# Patient Record
Sex: Male | Born: 1985 | Race: Black or African American | Hispanic: No | Marital: Single | Smoking: Never smoker
Health system: Southern US, Community
[De-identification: ages and names within clinical notes are randomized; demographics above are authoritative.]

## PROBLEM LIST (undated history)

## (undated) HISTORY — PX: WRIST RECONSTRUCTION: SHX2675

## (undated) HISTORY — PX: FEMUR SURGERY: SHX943

---

## 2016-07-09 ENCOUNTER — Ambulatory Visit: Payer: Self-pay | Admitting: Family

## 2016-07-17 ENCOUNTER — Ambulatory Visit: Payer: Self-pay | Admitting: Family

## 2016-07-31 ENCOUNTER — Encounter (HOSPITAL_COMMUNITY): Payer: Self-pay | Admitting: Emergency Medicine

## 2016-07-31 ENCOUNTER — Emergency Department (HOSPITAL_COMMUNITY)
Admission: EM | Admit: 2016-07-31 | Discharge: 2016-07-31 | Disposition: A | Discharge status: Home / Self Care | Payer: Self-pay | Attending: Emergency Medicine | Admitting: Emergency Medicine

## 2016-07-31 DIAGNOSIS — Z791 Long term (current) use of non-steroidal anti-inflammatories (NSAID): Secondary | ICD-10-CM | POA: Insufficient documentation

## 2016-07-31 DIAGNOSIS — Z79899 Other long term (current) drug therapy: Secondary | ICD-10-CM | POA: Insufficient documentation

## 2016-07-31 DIAGNOSIS — R197 Diarrhea, unspecified: Secondary | ICD-10-CM | POA: Insufficient documentation

## 2016-07-31 DIAGNOSIS — R112 Nausea with vomiting, unspecified: Secondary | ICD-10-CM | POA: Insufficient documentation

## 2016-07-31 LAB — URINALYSIS, ROUTINE W REFLEX MICROSCOPIC
BACTERIA UA: NONE SEEN
Glucose, UA: NEGATIVE mg/dL
Hgb urine dipstick: NEGATIVE
Ketones, ur: 20 mg/dL — AB
Nitrite: NEGATIVE
PROTEIN: 100 mg/dL — AB
Specific Gravity, Urine: 1.035 — ABNORMAL HIGH (ref 1.005–1.030)
pH: 6 (ref 5.0–8.0)

## 2016-07-31 LAB — CBC
HEMATOCRIT: 44.5 % (ref 39.0–52.0)
Hemoglobin: 15.6 g/dL (ref 13.0–17.0)
MCH: 28.5 pg (ref 26.0–34.0)
MCHC: 35.1 g/dL (ref 30.0–36.0)
MCV: 81.2 fL (ref 78.0–100.0)
Platelets: 214 10*3/uL (ref 150–400)
RBC: 5.48 MIL/uL (ref 4.22–5.81)
RDW: 13.7 % (ref 11.5–15.5)
WBC: 9.7 10*3/uL (ref 4.0–10.5)

## 2016-07-31 LAB — LIPASE, BLOOD: LIPASE: 19 U/L (ref 11–51)

## 2016-07-31 LAB — COMPREHENSIVE METABOLIC PANEL
ALT: 24 U/L (ref 17–63)
AST: 25 U/L (ref 15–41)
Albumin: 4.5 g/dL (ref 3.5–5.0)
Alkaline Phosphatase: 53 U/L (ref 38–126)
Anion gap: 9 (ref 5–15)
BUN: 15 mg/dL (ref 6–20)
CHLORIDE: 103 mmol/L (ref 101–111)
CO2: 27 mmol/L (ref 22–32)
CREATININE: 1.05 mg/dL (ref 0.61–1.24)
Calcium: 9.2 mg/dL (ref 8.9–10.3)
Glucose, Bld: 115 mg/dL — ABNORMAL HIGH (ref 65–99)
POTASSIUM: 3.2 mmol/L — AB (ref 3.5–5.1)
SODIUM: 139 mmol/L (ref 135–145)
Total Bilirubin: 0.7 mg/dL (ref 0.3–1.2)
Total Protein: 7.8 g/dL (ref 6.5–8.1)

## 2016-07-31 MED ORDER — ONDANSETRON HCL 4 MG PO TABS: 4.0000 mg | ORAL_TABLET | Freq: Three times a day (TID) | ORAL | 0 refills | Status: DC | PRN

## 2016-07-31 MED ORDER — ONDANSETRON 4 MG PO TBDP
4.0000 mg | ORAL_TABLET | Freq: Once | ORAL | Status: AC
  Administered 2016-07-31: 4 mg via ORAL
  Filled 2016-07-31: qty 1

## 2016-07-31 NOTE — ED Notes (Signed)
Pt informed of need for urine sample. Pt states he does not have to go at this time but was instructed to let nursing staff know when able to provide one. Upon entering the room pt drinking his second ginger ale and reports no nausea or vomiting while drinking.

## 2016-07-31 NOTE — ED Triage Notes (Signed)
Pt states he started having nausea and vomiting last night and a small amount of diarrhea today

## 2016-07-31 NOTE — ED Notes (Signed)
Lab put dark green blood tube in POC room if needed

## 2016-07-31 NOTE — ED Provider Notes (Signed)
[facility]EM[facility]ENCY DEPT Provider Note   CSN: [id number] Arrival date & time: 07/31/16  1904   By signing my name below, I, Keith Lopez, attest that this documentation has been prepared under the direction and in the presence of [name], Barbara CowerJason, MD. Electronically signed, Keith Lopez, ED Scribe. 07/31/16. 9:58 PM.   History   Chief Complaint Chief Complaint  Patient presents with  . Emesis   The history is provided by the patient and medical records. No language interpreter was used.    Keith Lopez is a 31 y.o. male who presents to the Emergency Department with concern for ongoing vomiting since last night. He states he ate old chicken prior to onset of symptoms and he drank 1 alcoholic beverage. Associated nausea, diarrhea, dysuria, dark urine, R sided sharp abdominal pains during vomiting spells noted. Pt states he has vomited ~25-30 since onset and last vomited 2-3 hours PTA. No diarrhea, vomiting noted since pt has been in [facility] ED. He describes stomach contents progressing to white and then yellow contents in his vomit. No illicit drugs noted. No modifying factors noted. Pt on hydrocodone and amoxicillin after having teeth pulled 1 week ago. No leg swelling, rash, sick contacts, recent travel, fever noted. No other complaints at this time.    History reviewed. No pertinent past medical history.  There are no active problems to display for this patient.   Past Surgical History:  Procedure Laterality Date  . FEMUR SURGERY    . WRIST RECONSTRUCTION         Home Medications    Prior to Admission medications   Medication Sig Start Date End Date Taking? Authorizing Provider  amoxicillin (AMOXIL) 875 MG tablet TAKE 1 TABLET BY MOUTH EVERY TWELVE HOURS 07/20/16  Yes [provider]  HYDROcodone-acetaminophen (NORCO/VICODIN) 5-325 MG tablet Take 1 tablet by mouth every 6 (six) hours as needed. for pain 07/20/16  Yes [provider]  ibuprofen (ADVIL,MOTRIN) 600 MG  tablet Take 600 mg by mouth every 6 (six) hours as needed. 07/20/16  Yes [provider]  ondansetron (ZOFRAN) 4 MG tablet Take 1 tablet (4 mg total) by mouth every 8 (eight) hours as needed for nausea or vomiting. 07/31/16   Tyan Lasure, Barbara CowerJason, MD    Family History History reviewed. No pertinent family history.  Social History Social History  Substance Use Topics  . Smoking status: Never Smoker  . Smokeless tobacco: Never Used  . Alcohol use Yes     Comment: rarely     Allergies   Patient has no known allergies.   Review of Systems Review of Systems All other systems reviewed and all systems are negative for acute changes except as noted in the HPI and PMH.    Physical Exam Updated Vital Signs BP 111/69 (BP Location: Right Arm)   Pulse 73   Temp 98.6 F (37 C) (Oral)   Resp 17   Ht 6\' 3"  (1.905 m)   Wt 210 lb (95.3 kg)   SpO2 100%   BMI 26.25 kg/m   Physical Exam  Constitutional: He is oriented to person, place, and time. He appears well-developed and well-nourished.  HENT:  Head: Normocephalic.  Mouth/Throat: Oropharynx is clear and moist.  Eyes: EOM are normal.  Neck: Normal range of motion.  Cardiovascular: Normal rate, regular rhythm, normal heart sounds and intact distal pulses.   Pulmonary/Chest: Effort normal and breath sounds normal. No respiratory distress.  Abdominal: Soft. Bowel sounds are normal. He exhibits no distension. There is no  tenderness. There is no rebound and no guarding.  Musculoskeletal: Normal range of motion.  Neurological: He is alert and oriented to person, place, and time.  Psychiatric: He has a normal mood and affect.  Nursing note and vitals reviewed.    ED Treatments / Results  DIAGNOSTIC STUDIES: Oxygen Saturation is 100% on RA, NL by my interpretation.    COORDINATION OF CARE: 9:54 PM-Discussed next steps with pt. Pt verbalized understanding and is agreeable with the plan. Pt prepared for d/c, advised of symptomatic care  at home and return precautions.    Labs (all labs ordered are listed, but only abnormal results are displayed) Labs Reviewed  COMPREHENSIVE METABOLIC PANEL - Abnormal; Notable for the following:       Result Value   Potassium 3.2 (*)    Glucose, Bld 115 (*)    All other components within normal limits  URINALYSIS, ROUTINE W REFLEX MICROSCOPIC - Abnormal; Notable for the following:    APPearance HAZY (*)    Specific Gravity, Urine 1.035 (*)    Bilirubin Urine SMALL (*)    Ketones, ur 20 (*)    Protein, ur 100 (*)    Leukocytes, UA SMALL (*)    Squamous Epithelial / LPF 0-5 (*)    All other components within normal limits  LIPASE, BLOOD  CBC    EKG  EKG Interpretation None       Radiology No results found.  Procedures Procedures (including critical care time)  Medications Ordered in ED Medications  ondansetron (ZOFRAN-ODT) disintegrating tablet 4 mg (4 mg Oral Given 07/31/16 2304)     Initial Impression / Assessment and Plan / ED Course  I have reviewed the triage vital signs and the nursing notes.  Pertinent labs & imaging results that were available during my care of the patient were reviewed by me and considered in my medical decision making (see chart for details).     Emesis that improved prior to arrival. Tolerating PO. Benign abdomen. Suspect viral cause at this time. Low suspicion for obstruction, bacterial diarrhea, IBD or other more serious causes at this time.   Final Clinical Impressions(s) / ED Diagnoses   Final diagnoses:  Nausea and vomiting, intractability of vomiting not specified, unspecified vomiting type    New Prescriptions Discharge Medication List as of 07/31/2016 11:31 PM    START taking these medications   Details  ondansetron (ZOFRAN) 4 MG tablet Take 1 tablet (4 mg total) by mouth every 8 (eight) hours as needed for nausea or vomiting., Starting Tue 07/31/2016, Print      I personally performed the services described in this  documentation, which was scribed in my presence. The recorded information has been reviewed and is accurate.      Marily Memos, MD 08/02/16 1226

## 2016-08-06 ENCOUNTER — Ambulatory Visit: Payer: Self-pay | Admitting: Family Medicine

## 2016-08-07 ENCOUNTER — Encounter: Payer: Self-pay | Admitting: Family Medicine

## 2018-01-31 ENCOUNTER — Ambulatory Visit (HOSPITAL_COMMUNITY)
Admission: RE | Admit: 2018-01-31 | Discharge: 2018-01-31 | Disposition: A | Discharge status: Home / Self Care | Payer: Self-pay | Source: Ambulatory Visit | Attending: Internal Medicine | Admitting: Internal Medicine

## 2018-01-31 ENCOUNTER — Other Ambulatory Visit (HOSPITAL_COMMUNITY): Payer: Self-pay | Admitting: Internal Medicine

## 2018-01-31 DIAGNOSIS — M25532 Pain in left wrist: Secondary | ICD-10-CM

## 2018-01-31 DIAGNOSIS — S62002A Unspecified fracture of navicular [scaphoid] bone of left wrist, initial encounter for closed fracture: Secondary | ICD-10-CM | POA: Insufficient documentation

## 2018-01-31 DIAGNOSIS — M19032 Primary osteoarthritis, left wrist: Secondary | ICD-10-CM | POA: Insufficient documentation

## 2018-01-31 DIAGNOSIS — X58XXXA Exposure to other specified factors, initial encounter: Secondary | ICD-10-CM | POA: Insufficient documentation

## 2018-03-04 ENCOUNTER — Ambulatory Visit: Payer: Self-pay | Admitting: Orthopaedic Surgery

## 2018-03-04 ENCOUNTER — Encounter: Payer: Self-pay | Admitting: Orthopaedic Surgery

## 2019-01-13 ENCOUNTER — Other Ambulatory Visit: Payer: Self-pay | Admitting: *Deleted

## 2019-01-13 DIAGNOSIS — Z20822 Contact with and (suspected) exposure to covid-19: Secondary | ICD-10-CM

## 2019-01-14 LAB — NOVEL CORONAVIRUS, NAA: SARS-CoV-2, NAA: NOT DETECTED

## 2019-03-24 IMAGING — DX DG WRIST COMPLETE 3+V*L*
4 series · 4 of 4 positions shown · non-contrast
Comparison: None.

CLINICAL DATA: Ulnar-sided left wrist pain. History of remote
fracture

EXAM:
LEFT WRIST - COMPLETE 3+ VIEW

[wrist pa]
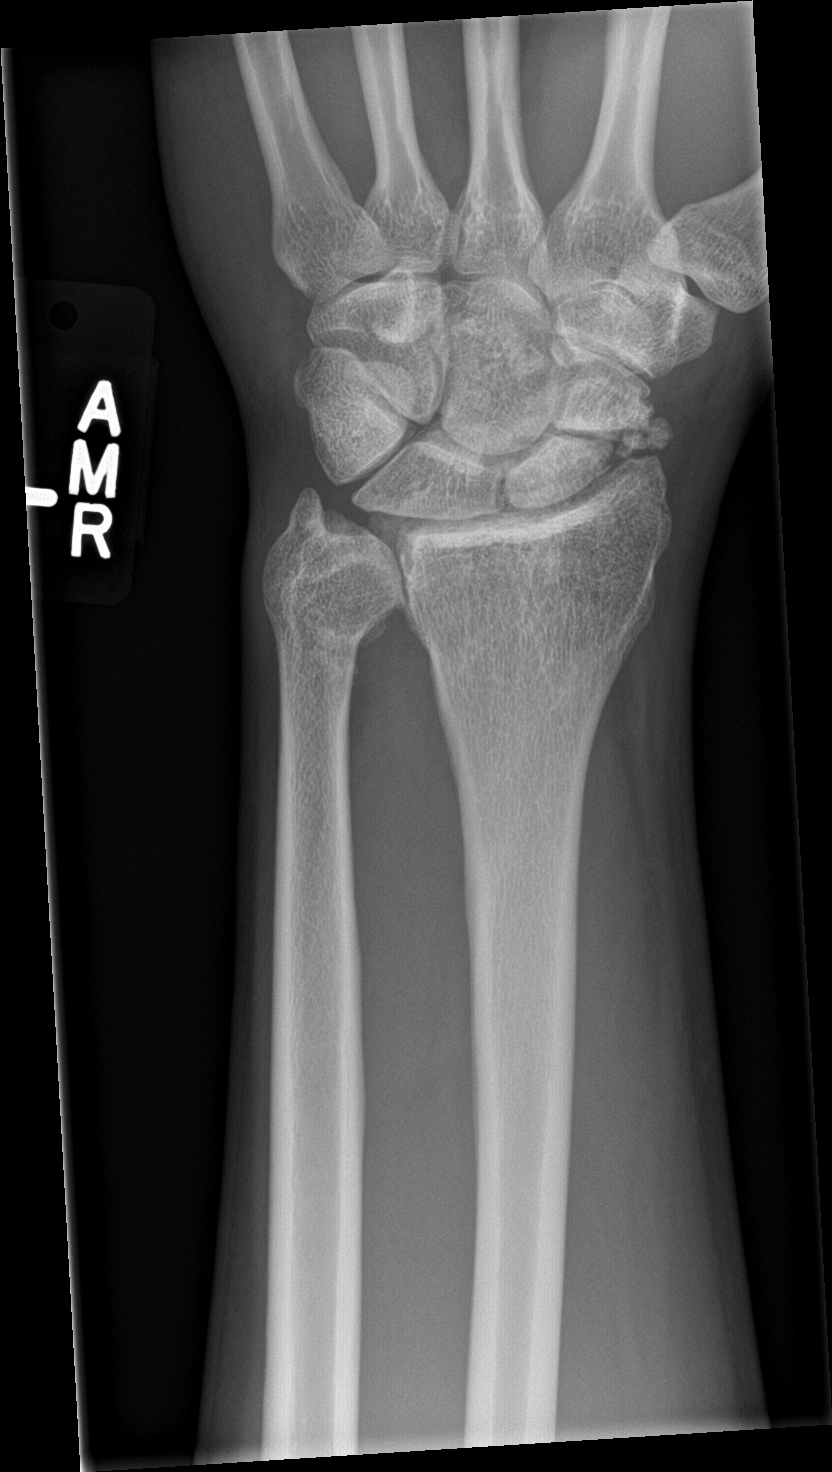

[wrist obl]
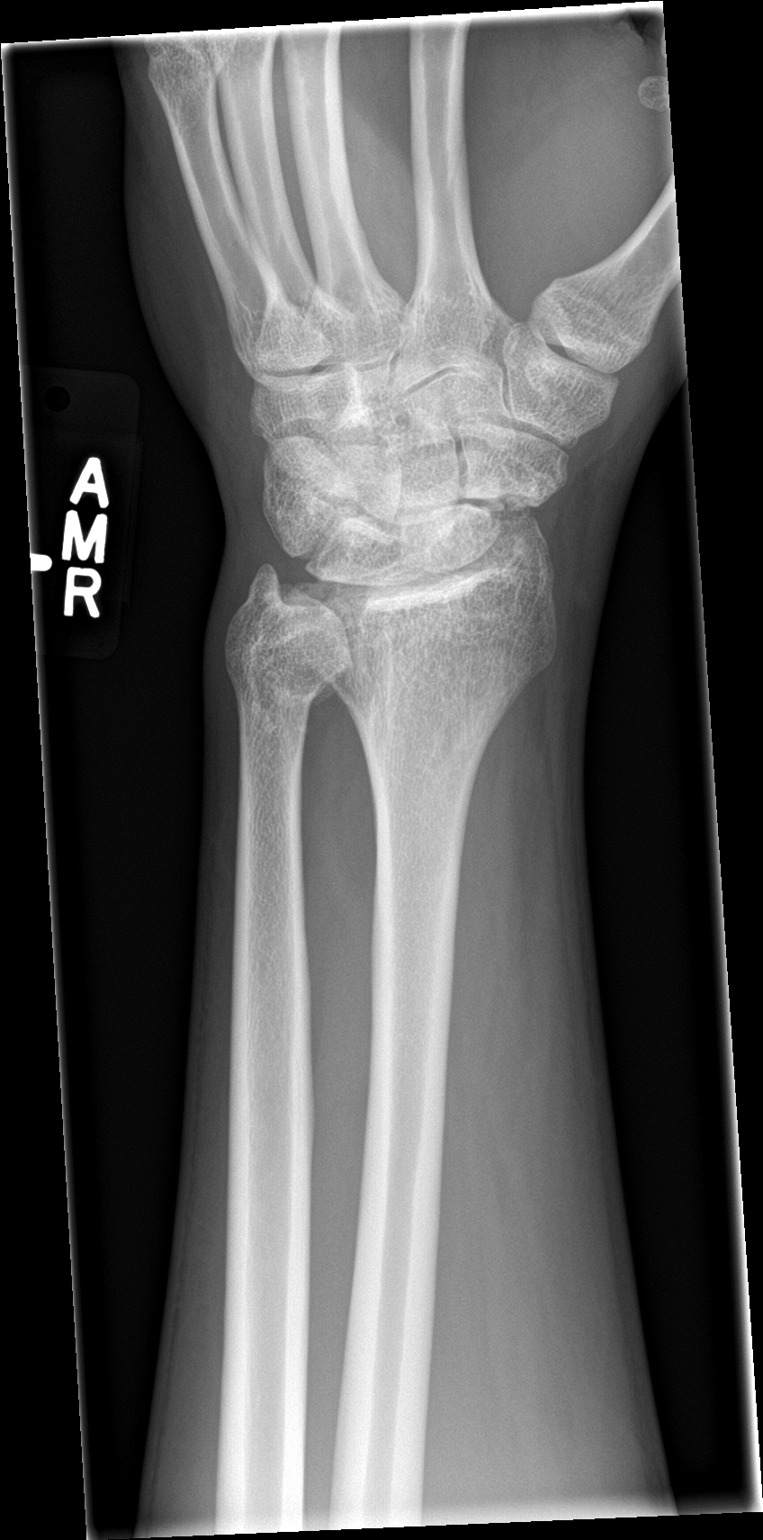

[wrist lat]
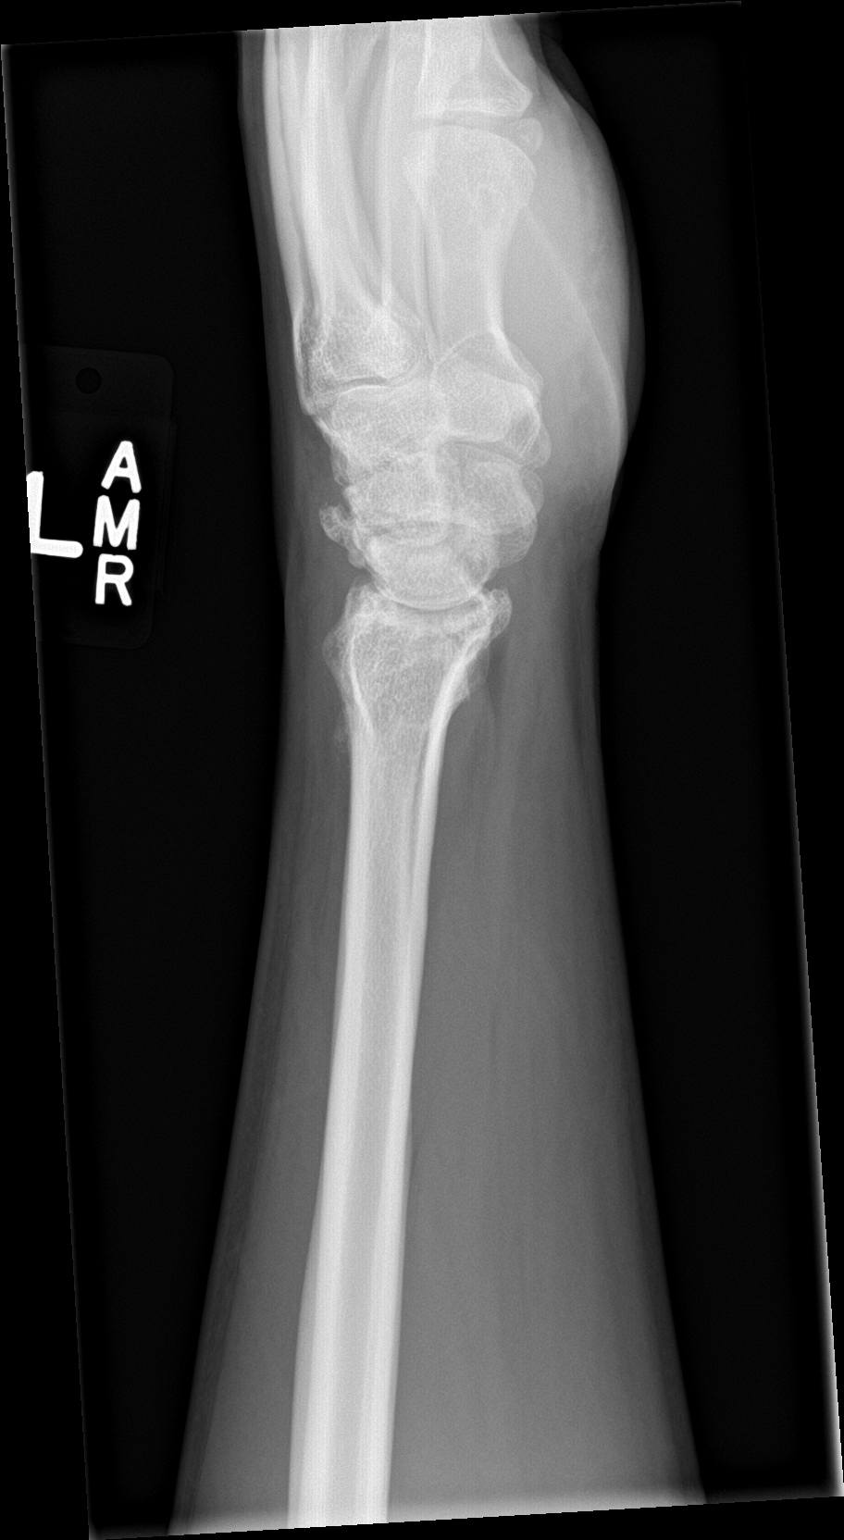

[wrist navicular]
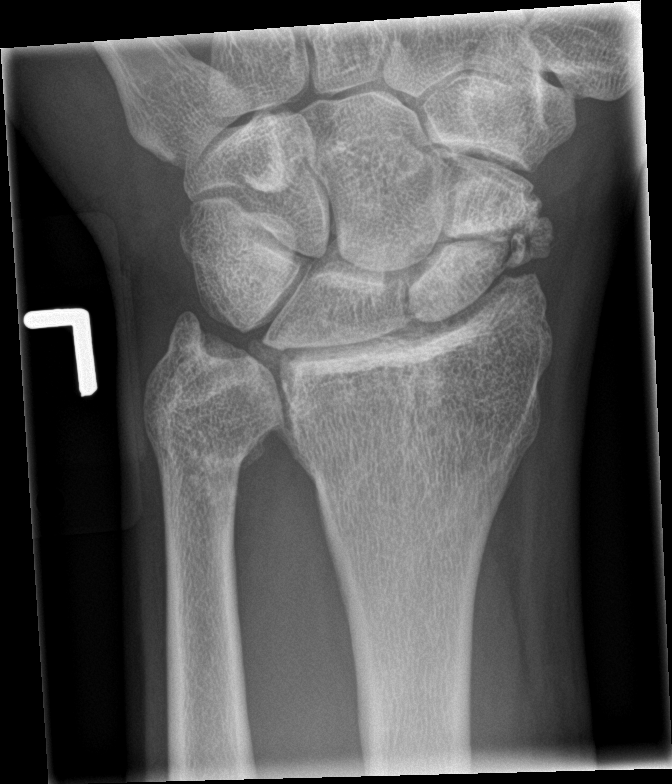

[4 of 4 positions shown; findings below may reference images not displayed]

FINDINGS: Nonacute fracture through the waist of the scaphoid with
fragmentation and marginal sclerosis. No sclerosis of the scaphoid
fragments. There is radioscaphoid narrowing. Degenerative spurring
about the wrist.
IMPRESSION: 1. Remote nonunited scaphoid waist fracture.
2. Radioscaphoid osteoarthritis.

## 2019-07-29 ENCOUNTER — Encounter (HOSPITAL_COMMUNITY): Payer: Self-pay | Admitting: Emergency Medicine

## 2019-07-29 ENCOUNTER — Emergency Department (HOSPITAL_COMMUNITY)
Admission: EM | Admit: 2019-07-29 | Discharge: 2019-07-29 | Disposition: A | Discharge status: Home / Self Care | Payer: Self-pay | Attending: Emergency Medicine | Admitting: Emergency Medicine

## 2019-07-29 ENCOUNTER — Other Ambulatory Visit: Payer: Self-pay

## 2019-07-29 DIAGNOSIS — M62838 Other muscle spasm: Secondary | ICD-10-CM | POA: Insufficient documentation

## 2019-07-29 MED ORDER — METHOCARBAMOL 750 MG PO TABS: 750.0000 mg | ORAL_TABLET | Freq: Every evening | ORAL | 0 refills | Status: DC | PRN

## 2019-07-29 MED ORDER — NAPROXEN 500 MG PO TABS: 500.0000 mg | ORAL_TABLET | Freq: Two times a day (BID) | ORAL | 0 refills | Status: DC

## 2019-07-29 MED ORDER — NAPROXEN 500 MG PO TABS: 500.0000 mg | ORAL_TABLET | Freq: Two times a day (BID) | ORAL | 0 refills | Status: AC

## 2019-07-29 MED ORDER — METHOCARBAMOL 750 MG PO TABS: 750.0000 mg | ORAL_TABLET | Freq: Every evening | ORAL | 0 refills | Status: AC | PRN

## 2019-07-29 NOTE — ED Triage Notes (Signed)
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

## 2019-07-29 NOTE — ED Provider Notes (Signed)
[facility] DEPT Provider Note   CSN: [id number] Arrival date & time: 07/29/19  6720     History Chief Complaint  Patient presents with  . Neck Pain    Keith Lopez is a 34 y.o. male.  HPI   34 year old male presenting for evaluation of right-sided neck pain that started yesterday.  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  States that the pain is worse when he moves his neck from side to side.  The pain is constant and severe in nature.  He is tried no interventions for symptoms.  Denies any numbness/weakness to the arms.  Denies any falls or trauma.  History reviewed. No pertinent past medical history.  There are no problems to display for this patient.   Past Surgical History:  Procedure Laterality Date  . FEMUR SURGERY    . WRIST RECONSTRUCTION         No family history on file.  Social History   Tobacco Use  . Smoking status: Never Smoker  . Smokeless tobacco: Never Used  Substance Use Topics  . Alcohol use: Yes    Comment: rarely  . Drug use: Yes    Types: Marijuana    Home Medications Prior to Admission medications   Medication Sig Start Date End Date Taking? Authorizing Provider  amoxicillin (AMOXIL) 875 MG tablet TAKE 1 TABLET BY MOUTH EVERY TWELVE HOURS 07/20/16   [provider]  HYDROcodone-acetaminophen (NORCO/VICODIN) 5-325 MG tablet Take 1 tablet by mouth every 6 (six) hours as needed. for pain 07/20/16   [provider]  ibuprofen (ADVIL,MOTRIN) 600 MG tablet Take 600 mg by mouth every 6 (six) hours as needed. 07/20/16   [provider]  methocarbamol (ROBAXIN) 750 MG tablet Take 1 tablet (750 mg total) by mouth at bedtime as needed for up to 5 days for muscle spasms. 07/29/19 08/03/19  Christorpher Hisaw S, PA-C  naproxen  (NAPROSYN) 500 MG tablet Take 1 tablet (500 mg total) by mouth 2 (two) times daily for 5 days. 07/29/19 08/03/19  Le Faulcon S, PA-C  ondansetron (ZOFRAN) 4 MG tablet Take 1 tablet (4 mg total) by mouth every 8 (eight) hours as needed for nausea or vomiting. 07/31/16   Mesner, Corene Cornea, MD    Allergies    Patient has no known allergies.  Review of Systems   Review of Systems  Constitutional: Negative for fever.  Musculoskeletal: Positive for neck pain.  Neurological: Negative for weakness and numbness.    Physical Exam Updated Vital Signs BP 132/89 (BP Location: Right Arm)   Pulse 72   Temp 98 F (36.7 C) (Oral)   Resp 16   SpO2 100%   Physical Exam Constitutional:      General: He is not in acute distress.    Appearance: He is well-developed.  Eyes:     Conjunctiva/sclera: Conjunctivae normal.  Cardiovascular:     Rate and Rhythm: Normal rate.  Pulmonary:     Effort: Pulmonary effort is normal.  Musculoskeletal:     Comments: No midline cervical spine tenderness.  TTP to the right cervical paraspinous muscles which reproduces symptoms.  5/5 strength of the bilateral upper and lower extremities.  Normal sensation throughout.  Normal radial pulses bilaterally.  Skin:    General: Skin is  warm and dry.  Neurological:     Mental Status: He is alert and oriented to person, place, and time.     ED Results / Procedures / Treatments   Labs (all labs ordered are listed, but only abnormal results are displayed) Labs Reviewed - No data to display  EKG None  Radiology No results found.  Procedures Procedures (including critical care time)  Medications Ordered in ED Medications - No data to display  ED Course  I have reviewed the triage vital signs and the nursing notes.  Pertinent labs & imaging results that were available during my care of the patient were reviewed by me and considered in my medical decision making (see chart for details).    MDM  Rules/Calculators/A&P                      34 year old male presenting for evaluation of right-sided neck pain.  Drives a forklift at work and frequently has to turn backwards for long periods of time.  On exam he has tenderness to the cervical paraspinous muscles consistent with muscle spasm.  No neuro deficits.  Will give Rx for naproxen and muscle relaxers.  Advised on follow-up with PCP and return precautions.  He voiced understanding plan reasons return requested.  Patient stable for discharge.   Final Clinical Impression(s) / ED Diagnoses Final diagnoses:  Muscle spasms of neck    Rx / DC Orders ED Discharge Orders         Ordered    naproxen (NAPROSYN) 500 MG tablet  2 times daily     07/29/19 1012    methocarbamol (ROBAXIN) 750 MG tablet  At bedtime PRN     07/29/19 1012           Dennise Bamber S, PA-C 07/29/19 1012    Bethann Berkshire, MD 07/29/19 1144

## 2019-07-29 NOTE — Discharge Instructions (Signed)

## 2021-11-21 ENCOUNTER — Ambulatory Visit: Payer: Self-pay | Admitting: Nurse Practitioner

## 2022-04-12 ENCOUNTER — Ambulatory Visit: Payer: Self-pay | Admitting: Family Medicine

## 2022-05-29 ENCOUNTER — Ambulatory Visit: Payer: 59 | Admitting: Family Medicine

## 2022-05-29 ENCOUNTER — Encounter: Payer: Self-pay | Admitting: Family Medicine

## 2022-05-29 VITALS — BP 132/82 | HR 51 | Temp 98.5°F | Ht 76.0 in | Wt 242.0 lb

## 2022-05-29 DIAGNOSIS — R0981 Nasal congestion: Secondary | ICD-10-CM | POA: Diagnosis not present

## 2022-05-29 DIAGNOSIS — H6993 Unspecified Eustachian tube disorder, bilateral: Secondary | ICD-10-CM

## 2022-05-29 NOTE — Progress Notes (Addendum)
New Patient Office Visit  Subjective   Patient ID: Keith Lopez, male    DOB: 07/30/1985  Age: 37 y.o. MRN: [id number]  CC:  Chief Complaint  Patient presents with   New Patient (Initial Visit)    Dyspnea nasal passage    HPI Keith Lopez presents to establish care Entrepreneur, sales cars, flips mobile homes  Denies substance use.  In a relationship with male   Congestion/Rhinorrhea  Denies fever, ear fullness, eyes watering and itchiness  Denies recent infection. Denies any trauma to nose.  Very active and doesn't feel that it is getting better.  Wakes up in the night short of breath  Feels that he cannot breathe if he is laying down.  Denies sinus tenderness.  Denies headache. Denies tooth pain.   Outpatient Encounter Medications as of 05/29/2022  Medication Sig   [DISCONTINUED] amoxicillin (AMOXIL) 875 MG tablet TAKE 1 TABLET BY MOUTH EVERY TWELVE HOURS   [DISCONTINUED] HYDROcodone-acetaminophen (NORCO/VICODIN) 5-325 MG tablet Take 1 tablet by mouth every 6 (six) hours as needed. for pain   [DISCONTINUED] ibuprofen (ADVIL,MOTRIN) 600 MG tablet Take 600 mg by mouth every 6 (six) hours as needed.   [DISCONTINUED] ondansetron (ZOFRAN) 4 MG tablet Take 1 tablet (4 mg total) by mouth every 8 (eight) hours as needed for nausea or vomiting.   No facility-administered encounter medications on file as of 05/29/2022.    No past medical history on file.  Past Surgical History:  Procedure Laterality Date   FEMUR SURGERY     WRIST RECONSTRUCTION      No family history on file.  Social History   Socioeconomic History   Marital status: Single    Spouse name: Not on file   Number of children: Not on file   Years of education: Not on file   Highest education level: Not on file  Occupational History   Not on file  Tobacco Use   Smoking status: Never   Smokeless tobacco: Never  Vaping Use   Vaping Use: Never used  Substance and Sexual Activity   Alcohol use:  Yes    Comment: rarely   Drug use: Yes    Types: Marijuana   Sexual activity: Not on file  Other Topics Concern   Not on file  Social History Narrative   Not on file   Social Determinants of Health   Financial Resource Strain: Not on file  Food Insecurity: Not on file  Transportation Needs: Not on file  Physical Activity: Not on file  Stress: Not on file  Social Connections: Not on file  Intimate Partner Violence: Not on file    Review of Systems  All other systems reviewed and are negative.  Objective   BP 132/82   Pulse (!) 51   Temp 98.5 F (36.9 C)   Ht '6\' 4"'$  (1.93 m)   Wt 242 lb (109.8 kg)   SpO2 99%   BMI 29.46 kg/m   Physical Exam Constitutional:      General: He is not in acute distress.    Appearance: Normal appearance. He is not ill-appearing, toxic-appearing or diaphoretic.  HENT:     Right Ear: Ear canal normal. No decreased hearing noted. No laceration, drainage, swelling or tenderness. A middle ear effusion is present. There is no impacted cerumen. No foreign body. No mastoid tenderness. No PE tube. No hemotympanum. Tympanic membrane is not injected, scarred, perforated, erythematous, retracted or bulging. Tympanic membrane has normal mobility.     Left Ear:  Ear canal normal. No decreased hearing noted. No laceration, drainage, swelling or tenderness.  No middle ear effusion. There is no impacted cerumen. No foreign body. No mastoid tenderness. No PE tube. No hemotympanum. Tympanic membrane is not injected, scarred, perforated, erythematous, retracted or bulging. Tympanic membrane has normal mobility.     Nose: Congestion and rhinorrhea present. No nasal deformity, signs of injury or nasal tenderness.     Right Nostril: Occlusion present.     Left Nostril: No occlusion.     Right Turbinates: Enlarged and swollen.     Left Turbinates: Enlarged and swollen.     Right Sinus: No maxillary sinus tenderness or frontal sinus tenderness.     Left Sinus: No  maxillary sinus tenderness or frontal sinus tenderness.     Mouth/Throat:     Lips: Pink.     Mouth: Mucous membranes are moist. No injury, lacerations, oral lesions or angioedema.     Tongue: No lesions. Tongue does not deviate from midline.     Palate: No mass and lesions.     Pharynx: Oropharynx is clear.     Tonsils: No tonsillar exudate or tonsillar abscesses.  Cardiovascular:     Rate and Rhythm: Normal rate.     Pulses: Normal pulses.     Heart sounds: Normal heart sounds. No murmur heard.    No gallop.  Pulmonary:     Effort: Pulmonary effort is normal. No respiratory distress.     Breath sounds: Normal breath sounds. No stridor. No wheezing, rhonchi or rales.  Skin:    General: Skin is warm.     Capillary Refill: Capillary refill takes less than 2 seconds.  Neurological:     General: No focal deficit present.     Mental Status: He is alert and oriented to person, place, and time. Mental status is at baseline.     Motor: No weakness.  Psychiatric:        Mood and Affect: Mood normal.        Behavior: Behavior normal.        Thought Content: Thought content normal.        Judgment: Judgment normal.    Assessment & Plan:  1. Congestion of nasal sinus 2. Dysfunction of both eustachian tubes Discussed with patient to initiate OTC treatment of cetrizine and fluticasone. Pt verbalized that he would be able to obtain medications. Instructed pt to follow up after one month of treatment for CPE and to evaluate treatment efficacy. If not improved, will place referral to ENT.   The above assessment and management plan was discussed with the patient. The patient verbalized understanding of and has agreed to the management plan using shared-decision making. Patient is aware to call the clinic if they develop any new symptoms or if symptoms fail to improve or worsen. Patient is aware when to return to the clinic for a follow-up visit. Patient educated on when it is appropriate to go to  the emergency department.   1 month follow up for [place], DNP-FNP [facility] [address] [place], [place] [address] [phone]

## 2022-07-05 ENCOUNTER — Ambulatory Visit: Payer: Self-pay | Admitting: Family Medicine

## 2022-09-11 ENCOUNTER — Ambulatory Visit (INDEPENDENT_AMBULATORY_CARE_PROVIDER_SITE_OTHER): Payer: 59 | Admitting: Nurse Practitioner

## 2022-09-11 ENCOUNTER — Encounter: Payer: Self-pay | Admitting: Nurse Practitioner

## 2022-09-11 VITALS — BP 125/76 | HR 64 | Temp 97.9°F | Ht 76.0 in | Wt 238.8 lb

## 2022-09-11 DIAGNOSIS — J302 Other seasonal allergic rhinitis: Secondary | ICD-10-CM | POA: Diagnosis not present

## 2022-09-11 MED ORDER — FEXOFENADINE HCL 180 MG PO TABS: 180.0000 mg | ORAL_TABLET | Freq: Every day | ORAL | 0 refills | Status: AC

## 2022-09-11 MED ORDER — AZELASTINE HCL 0.1 % NA SOLN: 2.0000 | Freq: Two times a day (BID) | NASAL | 1 refills | Status: DC

## 2022-09-11 NOTE — Progress Notes (Signed)
Acute Office Visit  Subjective:     Patient ID: Keith Lopez, male    DOB: 1986-03-25, 37 y.o.   MRN: [id number]  Chief Complaint  Patient presents with   Nasal Congestion    Has been going on since last November. Flonase and zyrtec no longer helping, got steroid shot at urgent care did not help either.     HPI SUBJECTIVE:  Keith Lopez is a 37 y.o. male who complains of congestion, sneezing, and dry cough for 2 months days.PMH of allergic rhinitis currently on zyrtec and Flonase with minimal relieve. Went to urgent care and give a steroid injection.  He denies a history of chest pain, chills, dizziness, fatigue, fevers, myalgias, and shortness of breath and denies a history of asthma. Patient denies smoke cigarettes.    ROS Negative unless indicated in HPI    Objective:    BP 125/76   Pulse 64   Temp 97.9 F (36.6 C) (Temporal)   Ht 6\' 4"  (1.93 m)   Wt 238 lb 12.8 oz (108.3 kg)   SpO2 98%   BMI 29.07 kg/m  BP Readings from Last 3 Encounters:  09/11/22 125/76  05/29/22 132/82  07/29/19 132/85   Wt Readings from Last 3 Encounters:  09/11/22 238 lb 12.8 oz (108.3 kg)  05/29/22 242 lb (109.8 kg)  07/31/16 210 lb (95.3 kg)      Physical Exam Constitutional:      General: He is not in acute distress.    Appearance: Normal appearance.  HENT:     Head: Normocephalic and atraumatic.     Right Ear: Hearing normal.     Left Ear: Hearing normal.     Nose: Congestion and rhinorrhea present. Rhinorrhea is clear.     Right Sinus: Frontal sinus tenderness present.     Left Sinus: Frontal sinus tenderness present.  Cardiovascular:     Rate and Rhythm: Normal rate.     Heart sounds: Normal heart sounds.  Pulmonary:     Effort: Pulmonary effort is normal.     Breath sounds: Normal breath sounds.  Skin:    General: Skin is warm and dry.     Findings: No rash.     Comments: BUE tattoos  Neurological:     General: No focal deficit present.     Mental Status:  He is alert and oriented to person, place, and time. Mental status is at baseline.  Psychiatric:        Mood and Affect: Mood normal.        Behavior: Behavior normal.     No results found for any visits on 09/11/22.      Assessment & Plan:  Seasonal allergic rhinitis, unspecified trigger -     Fexofenadine HCl; Take 1 tablet (180 mg total) by mouth daily.  Dispense: 90 tablet; Refill: 0 -     Azelastine HCl; Place 2 sprays into both nostrils 2 (two) times daily. Use in each nostril as directed  Dispense: 30 mL; Refill: 1   ASSESSMENT:  viral upper respiratory illness/allergic rhinitis  PLAN: D/c Zyrtec and start Allegra _ Astepro 1-2 spray in each - advice him on getting local honey and to used a plastic or wooden spoon add to tea to help allergies.  use vaporizer or mist prn Call or return to clinic prn if these symptoms worsen or fail to improve as anticipated.  He is interested in getting a referral to get an allergy test. Return if symptoms worsen  or fail to improve.  [address] Santa Lighter DNP

## 2023-01-16 ENCOUNTER — Ambulatory Visit: Payer: 59 | Admitting: Family Medicine

## 2023-01-18 ENCOUNTER — Encounter: Payer: Self-pay | Admitting: Family Medicine

## 2023-01-18 ENCOUNTER — Ambulatory Visit: Payer: 59 | Admitting: Family Medicine

## 2023-01-18 VITALS — BP 136/79 | HR 60 | Temp 98.3°F | Ht 75.0 in | Wt 241.0 lb

## 2023-01-18 DIAGNOSIS — F321 Major depressive disorder, single episode, moderate: Secondary | ICD-10-CM | POA: Diagnosis not present

## 2023-01-18 DIAGNOSIS — R0981 Nasal congestion: Secondary | ICD-10-CM

## 2023-01-18 DIAGNOSIS — F419 Anxiety disorder, unspecified: Secondary | ICD-10-CM

## 2023-01-18 DIAGNOSIS — F1990 Other psychoactive substance use, unspecified, uncomplicated: Secondary | ICD-10-CM

## 2023-01-18 DIAGNOSIS — R112 Nausea with vomiting, unspecified: Secondary | ICD-10-CM

## 2023-01-18 DIAGNOSIS — Z136 Encounter for screening for cardiovascular disorders: Secondary | ICD-10-CM

## 2023-01-18 DIAGNOSIS — M545 Low back pain, unspecified: Secondary | ICD-10-CM

## 2023-01-18 MED ORDER — PREDNISONE 20 MG PO TABS: 40.0000 mg | ORAL_TABLET | Freq: Every day | ORAL | 0 refills | Status: AC

## 2023-01-18 MED ORDER — HYDROXYZINE PAMOATE 25 MG PO CAPS: 25.0000 mg | ORAL_CAPSULE | Freq: Three times a day (TID) | ORAL | 0 refills | Status: AC | PRN

## 2023-01-18 MED ORDER — BACLOFEN 10 MG PO TABS: 10.0000 mg | ORAL_TABLET | Freq: Three times a day (TID) | ORAL | 0 refills | Status: AC

## 2023-01-18 NOTE — Progress Notes (Addendum)
Subjective:  Patient ID: Keith Lopez, male    DOB: 08-20-1985, 37 y.o.   MRN: [id number]  Patient Care Team: Arrie Senate, FNP as PCP - General (Family Medicine)   Chief Complaint:  Medical Management of Chronic Issues (Patient experiencing mood swings- frustration depression and anxiety/Patient believes this is the couse his loss of appetite and emesis/Patient experiencing lower back pain (sharp) leftside)  HPI: Keith Lopez is a 37 y.o. male presenting on 01/18/2023 for Medical Management of Chronic Issues (Patient experiencing mood swings- frustration depression and anxiety/Patient believes this is the couse his loss of appetite and emesis/Patient experiencing lower back pain (sharp) leftside)  1) Anxiety/Depression  States that he is having moving swings. Reports anxiety, depression. Reports that he is not sleeping well, has lack of motivation to do tasks, constantly worried. Does not identify any specific triggers but states that he "thinks too much about life in general". Denies active plans. States that he has too many people depending on him. States that he smokes marijuana occasionally 1-2 times per day. States that he has had panic attacks in the past, now mainly has trouble with thought control. States that he has some episodes of N/V. He is unsure if it is related to his anxiety/depression. States that he is trying to eat right, typically happens after exercise. Reports drinking 3 bottles of water if he is active.   2) Sharp pain in back  States that he went to pick up his daughter and was not able to due to a sharp pain in his lower back. States that it started 2 weeks ago. Intermittent with movement. States that it will sometimes last a few hours. Tries to stretch and play sports to "run it out". Has tried tylenol and ibuprofen which help some.   3) Congestion  States that it started several weeks ago. Has been taking allegra and Flonase. States that he  coughs and sneezes every now and then but it is not significant as congestion.  Relevant past medical, surgical, family, and social history reviewed and updated as indicated.  Allergies and medications reviewed and updated. Data reviewed: Chart in Epic.  No past medical history on file.  Past Surgical History:  Procedure Laterality Date   FEMUR SURGERY     WRIST RECONSTRUCTION      Social History   Socioeconomic History   Marital status: Single    Spouse name: Not on file   Number of children: Not on file   Years of education: Not on file   Highest education level: Not on file  Occupational History   Not on file  Tobacco Use   Smoking status: Never   Smokeless tobacco: Never  Vaping Use   Vaping status: Never Used  Substance and Sexual Activity   Alcohol use: Yes    Comment: rarely   Drug use: Yes    Types: Marijuana   Sexual activity: Not on file  Other Topics Concern   Not on file  Social History Narrative   Not on file   Social Determinants of Health   Financial Resource Strain: Not on file  Food Insecurity: Not on file  Transportation Needs: Not on file  Physical Activity: Not on file  Stress: Not on file  Social Connections: Not on file  Intimate Partner Violence: Not on file    Outpatient Encounter Medications as of 01/18/2023  Medication Sig   azelastine (ASTELIN) 0.1 % nasal spray Place 2 sprays into both nostrils 2 (  two) times daily. Use in each nostril as directed   fexofenadine (ALLEGRA ALLERGY) 180 MG tablet Take 1 tablet (180 mg total) by mouth daily.   fluticasone (FLONASE) 50 MCG/ACT nasal spray Place into both nostrils daily.   No facility-administered encounter medications on file as of 01/18/2023.    No Known Allergies  Review of Systems As per HPI  Objective:  BP 136/79   Pulse 60   Temp 98.3 F (36.8 C)   Ht 6\' 3"  (1.905 m)   Wt 241 lb (109.3 kg)   SpO2 98%   BMI 30.12 kg/m    Wt Readings from Last 3 Encounters:  09/11/22  238 lb 12.8 oz (108.3 kg)  05/29/22 242 lb (109.8 kg)  07/31/16 210 lb (95.3 kg)    Physical Exam Constitutional:      General: He is awake. He is not in acute distress.    Appearance: Normal appearance. He is well-developed and well-groomed. He is not ill-appearing, toxic-appearing or diaphoretic.  HENT:     Nose: Congestion present. No rhinorrhea.  Cardiovascular:     Rate and Rhythm: Normal rate and regular rhythm.     Pulses: Normal pulses.          Radial pulses are 2+ on the right side and 2+ on the left side.       Posterior tibial pulses are 2+ on the right side and 2+ on the left side.     Heart sounds: Normal heart sounds. No murmur heard.    No gallop.  Pulmonary:     Effort: Pulmonary effort is normal. No respiratory distress.     Breath sounds: Normal breath sounds. No stridor. No wheezing, rhonchi or rales.  Musculoskeletal:     Cervical back: Full passive range of motion without pain and neck supple.     Lumbar back: Spasms present. No swelling, edema, deformity, signs of trauma, lacerations, tenderness or bony tenderness. Decreased range of motion. Negative right straight leg raise test and negative left straight leg raise test. No scoliosis.     Right lower leg: No edema.     Left lower leg: No edema.  Skin:    General: Skin is warm.     Capillary Refill: Capillary refill takes less than 2 seconds.  Neurological:     General: No focal deficit present.     Mental Status: He is alert, oriented to person, place, and time and easily aroused. Mental status is at baseline.     GCS: GCS eye subscore is 4. GCS verbal subscore is 5. GCS motor subscore is 6.     Motor: No weakness.  Psychiatric:        Attention and Perception: Attention and perception normal.        Mood and Affect: Mood and affect normal.        Speech: Speech normal.        Behavior: Behavior normal. Behavior is cooperative.        Thought Content: Thought content normal. Thought content does not  include homicidal or suicidal ideation. Thought content does not include homicidal or suicidal plan.        Cognition and Memory: Cognition and memory normal.        Judgment: Judgment normal.     Results for orders placed or performed in visit on 01/13/19  Novel Coronavirus, NAA ([facility])   Specimen: Oropharyngeal(OP) collection in vial transport medium   OROPHARYNGEA  TESTING  Result Value Ref Range  SARS-CoV-2, NAA Not Detected Not Detected       09/11/2022   12:14 PM  Depression screen PHQ 2/9  Decreased Interest 2  Down, Depressed, Hopeless 2  PHQ - 2 Score 4  Altered sleeping 2  Tired, decreased energy 2  Change in appetite 2  Feeling bad or failure about yourself  2  Trouble concentrating 2  Moving slowly or fidgety/restless 1  Suicidal thoughts 0  PHQ-9 Score 15  Difficult doing work/chores Somewhat difficult      09/11/2022   12:14 PM  GAD 7 : Generalized Anxiety Score  Nervous, Anxious, on Edge 0  Control/stop worrying 1  Worry too much - different things 1  Trouble relaxing 1  Restless 2  Easily annoyed or irritable 2  Afraid - awful might happen 0  Total GAD 7 Score 7  Anxiety Difficulty Not difficult at all   Pertinent labs & imaging results that were available during my care of the patient were reviewed by me and considered in my medical decision making.  Assessment & Plan:  Keith Lopez was seen today for medical management of chronic issues.  Diagnoses and all orders for this visit: 1. Depression, major, single episode, moderate (HCC) Patient screened positive for depression and anxiety. Patient did wish to start counseling at this time. Referral placed as below. Patient did not wish to take a daily medication. Will start with as needed for acute episodes of anxiety as below. Discussed side effects with patient. Discussed at length role of marijuana and self-medication with substances. Labs as below. Will communicate results to patient once available.  Will await results to determine next steps.  Patient and/or legal guardian verbally consented to [facility] services about presenting concerns and psychiatric consultation as appropriate.  The services will be billed as appropriate for the patient. - hydrOXYzine (VISTARIL) 25 MG capsule; Take 1 capsule (25 mg total) by mouth every 8 (eight) hours as needed.  Dispense: 30 capsule; Refill: 0 - Ambulatory referral to Integrated Behavioral Health - CBC with Differential/Platelet - CMP14+EGFR  2. Anxiety As above.  - hydrOXYzine (VISTARIL) 25 MG capsule; Take 1 capsule (25 mg total) by mouth every 8 (eight) hours as needed.  Dispense: 30 capsule; Refill: 0 - Ambulatory referral to Integrated Behavioral Health - CBC with Differential/Platelet - CMP14+EGFR  3. Nausea and vomiting, unspecified vomiting type Discussed at length with patient role that marijuana can contribute to symptoms. Labs as below. Will communicate results to patient once available. Will await results to determine next steps.  - Vitamin B12 - VITAMIN D 25 Hydroxy (Vit-D Deficiency, Fractures) - TSH  4. Acute left-sided low back pain without sciatica Will start medication as below for acute low back pain with spasms. Referral placed for PT. Discussed at home supportive therapies such as heat, ice, tens units  - predniSONE (DELTASONE) 20 MG tablet; Take 2 tablets (40 mg total) by mouth daily with breakfast for 5 days.  Dispense: 10 tablet; Refill: 0 - baclofen (LIORESAL) 10 MG tablet; Take 1 tablet (10 mg total) by mouth 3 (three) times daily.  Dispense: 30 each; Refill: 0 - Ambulatory referral to Physical Therapy  5. Congestion of nasal sinus Will provide medication as below. Patient to continue allegra and flonase. If symptoms do not improve will refer to ENT  - predniSONE (DELTASONE) 20 MG tablet; Take 2 tablets (40 mg total) by mouth daily with breakfast for 5 days.  Dispense: 10 tablet; Refill:  0  6. Substance  use Discussed impact of substance use with medications. Encouraged patient at length to consider quitting.  - Vitamin B12 - VITAMIN D 25 Hydroxy (Vit-D Deficiency, Fractures) - TSH  7. Encounter for screening for cardiovascular disorders Labs as below. Will communicate results to patient once available. Will await results to determine next steps.  Fasting.  - Lipid panel   Continue all other maintenance medications.  Follow up plan: Return in about 2 months (around 03/20/2023) for Depression/Anxiety follow up .   Continue healthy lifestyle choices, including diet (rich in fruits, vegetables, and lean proteins, and low in salt and simple carbohydrates) and exercise (at least 30 minutes of moderate physical activity daily).  Written and verbal instructions provided   The above assessment and management plan was discussed with the patient. The patient verbalized understanding of and has agreed to the management plan. Patient is aware to call the clinic if they develop any new symptoms or if symptoms persist or worsen. Patient is aware when to return to the clinic for a follow-up visit. Patient educated on when it is appropriate to go to the emergency department.   Neale Burly, DNP-FNP [facility] [address] [place], [place] [address] [phone]

## 2023-01-19 LAB — CMP14+EGFR
ALT: 40 [IU]/L (ref 0–44)
AST: 22 IU/L (ref 0–40)
Albumin: 4.1 g/dL (ref 4.1–5.1)
Alkaline Phosphatase: 72 [IU]/L (ref 44–121)
BUN/Creatinine Ratio: 7 — ABNORMAL LOW (ref 9–20)
BUN: 8 mg/dL (ref 6–20)
Bilirubin Total: <0.2 mg/dL (ref 0.0–1.2)
CO2: 24 mmol/L (ref 20–29)
Calcium: 8.9 mg/dL (ref 8.7–10.2)
Chloride: 104 mmol/L (ref 96–106)
Creatinine, Ser: 1.17 mg/dL (ref 0.76–1.27)
Globulin, Total: 2.5 g/dL (ref 1.5–4.5)
Glucose: 95 mg/dL (ref 70–99)
Potassium: 4.1 mmol/L (ref 3.5–5.2)
Sodium: 141 mmol/L (ref 134–144)
Total Protein: 6.6 g/dL (ref 6.0–8.5)
eGFR: 83 mL/min/{1.73_m2} (ref >59)

## 2023-01-19 LAB — CBC WITH DIFFERENTIAL/PLATELET
Basophils Absolute: 0 10*3/uL (ref 0.0–0.2)
Basos: 0 %
EOS (ABSOLUTE): 0.2 10*3/uL (ref 0.0–0.4)
Eos: 2 %
Hematocrit: 40.5 % (ref 37.5–51.0)
Hemoglobin: 13.3 g/dL (ref 13.0–17.7)
Immature Grans (Abs): 0 10*3/uL (ref 0.0–0.1)
Immature Granulocytes: 0 %
Lymphocytes Absolute: 3.1 10*3/uL (ref 0.7–3.1)
Lymphs: 43 %
MCH: 27 pg (ref 26.6–33.0)
MCHC: 32.8 g/dL (ref 31.5–35.7)
MCV: 82 fL (ref 79–97)
Monocytes Absolute: 0.4 10*3/uL (ref 0.1–0.9)
Monocytes: 5 %
Neutrophils Absolute: 3.4 10*3/uL (ref 1.4–7.0)
Neutrophils: 50 %
Platelets: 245 10*3/uL (ref 150–450)
RBC: 4.92 x10E6/uL (ref 4.14–5.80)
RDW: 13.1 % (ref 11.6–15.4)
WBC: 7.1 10*3/uL (ref 3.4–10.8)

## 2023-01-19 LAB — VITAMIN B12: Vitamin B-12: 841 pg/mL (ref 232–1245)

## 2023-01-19 LAB — VITAMIN D 25 HYDROXY (VIT D DEFICIENCY, FRACTURES): Vit D, 25-Hydroxy: 23.4 ng/mL — ABNORMAL LOW (ref 30.0–100.0)

## 2023-01-19 LAB — LIPID PANEL
Chol/HDL Ratio: 3.4 ratio (ref 0.0–5.0)
Cholesterol, Total: 144 mg/dL (ref 100–199)
HDL: 42 mg/dL (ref >39)
LDL Chol Calc (NIH): 79 mg/dL (ref 0–99)
Triglycerides: 126 mg/dL (ref 0–149)
VLDL Cholesterol Cal: 23 mg/dL (ref 5–40)

## 2023-01-19 LAB — TSH: TSH: 1.41 u[IU]/mL (ref 0.450–4.500)

## 2023-01-22 NOTE — Progress Notes (Signed)
Vitamin D slightly low. Recommend daily supplement with [phone] international units  daily. Slight variation in BUN/Crt ratio. Some variation is normal and expected. All other labs normal.

## 2023-02-11 ENCOUNTER — Other Ambulatory Visit: Payer: Self-pay | Admitting: *Deleted

## 2023-02-11 DIAGNOSIS — J302 Other seasonal allergic rhinitis: Secondary | ICD-10-CM

## 2023-02-11 MED ORDER — AZELASTINE HCL 0.1 % NA SOLN: 2.0000 | Freq: Two times a day (BID) | NASAL | 1 refills | Status: AC

## 2023-05-09 ENCOUNTER — Ambulatory Visit (INDEPENDENT_AMBULATORY_CARE_PROVIDER_SITE_OTHER): Payer: 59 | Admitting: Family Medicine

## 2023-05-09 ENCOUNTER — Encounter: Payer: Self-pay | Admitting: Family Medicine

## 2023-05-09 ENCOUNTER — Ambulatory Visit (INDEPENDENT_AMBULATORY_CARE_PROVIDER_SITE_OTHER): Payer: 59

## 2023-05-09 VITALS — BP 116/75 | HR 57 | Temp 98.2°F | Ht 75.0 in | Wt 243.0 lb

## 2023-05-09 DIAGNOSIS — M25532 Pain in left wrist: Secondary | ICD-10-CM

## 2023-05-09 DIAGNOSIS — M545 Low back pain, unspecified: Secondary | ICD-10-CM | POA: Diagnosis not present

## 2023-05-09 DIAGNOSIS — F321 Major depressive disorder, single episode, moderate: Secondary | ICD-10-CM | POA: Diagnosis not present

## 2023-05-09 DIAGNOSIS — F1990 Other psychoactive substance use, unspecified, uncomplicated: Secondary | ICD-10-CM

## 2023-05-09 DIAGNOSIS — J329 Chronic sinusitis, unspecified: Secondary | ICD-10-CM | POA: Insufficient documentation

## 2023-05-09 DIAGNOSIS — G8929 Other chronic pain: Secondary | ICD-10-CM | POA: Diagnosis not present

## 2023-05-09 DIAGNOSIS — F419 Anxiety disorder, unspecified: Secondary | ICD-10-CM | POA: Diagnosis not present

## 2023-05-09 MED ORDER — MONTELUKAST SODIUM 10 MG PO TABS: 10.0000 mg | ORAL_TABLET | Freq: Every day | ORAL | 3 refills | Status: DC

## 2023-05-09 MED ORDER — NAPROXEN 500 MG PO TABS: 500.0000 mg | ORAL_TABLET | Freq: Two times a day (BID) | ORAL | 0 refills | Status: AC

## 2023-05-09 MED ORDER — SERTRALINE HCL 50 MG PO TABS: 50.0000 mg | ORAL_TABLET | Freq: Every day | ORAL | 2 refills | Status: DC

## 2023-05-09 MED ORDER — FLUTICASONE PROPIONATE 50 MCG/ACT NA SUSP: 1.0000 | Freq: Every day | NASAL | 6 refills | Status: AC

## 2023-05-09 NOTE — Progress Notes (Signed)
Subjective:  Patient ID: Keith Lopez, male    DOB: 06/09/1985, 38 y.o.   MRN: [id number]  Patient Care Team: Arrie Senate, FNP as PCP - General (Family Medicine)   Chief Complaint:  congestion/rhinorrhea (Chronic condition/allergy related)   HPI: Keith Lopez is a 38 y.o. male presenting on 05/09/2023 for congestion/rhinorrhea (Chronic condition/allergy related)  HPI 1. Depression, major, single episode, moderate (HCC)/2. Anxiety States that hydroxyzine did not really help, he was taking it once every 3 days. States that he has lack of motivation, self-isolation, "feels like something is off". Denies any thoughts of self harm. States that he "thinks too much". Has previously tried xanax and that worked well for him, but he stopped it and mainly used marijuana as needed.   3. Chronic sinusitis, unspecified location Has tried flonase and zyrtec, then switched to flonase and allegra, states that he continues to have symptoms. States that it is getting worse. States that it is mainly congestion, feels that he can't breathe through his nose a lot of times. Started 1.5 years ago.   4. Back pain  States that baclofen made his back hurt worse. Denies fall or trauma. Does not work in [employer]. Denies incontinence, saddle anesthesia. Continues to have pain. States that it ranges from 3/10 to 10/10. Mainly in the mornings, gets better through the day. Completes stretching. Has taken tylenol and ibuprofen intermittently for pain.   5. Wrist pain, Left  Started a few years ago after fracture. Continues to have pain that comes and go. With both activity and at rest. Tries stretching to help, OTC analgesics. Would like to go to PT for it and back.   6. Vitamin D deficiency  Has not started taking supplement. Endorses pain in wrist and back pain. Denies recent fracture.   Relevant past medical, surgical, family, and social history reviewed and updated as indicated.   Allergies and medications reviewed and updated. Data reviewed: Chart in Epic.   History reviewed. No pertinent past medical history.  Past Surgical History:  Procedure Laterality Date   FEMUR SURGERY     WRIST RECONSTRUCTION      Social History   Socioeconomic History   Marital status: Single    Spouse name: Not on file   Number of children: Not on file   Years of education: Not on file   Highest education level: Not on file  Occupational History   Not on file  Tobacco Use   Smoking status: Never   Smokeless tobacco: Never  Vaping Use   Vaping status: Never Used  Substance and Sexual Activity   Alcohol use: Yes    Comment: rarely   Drug use: Yes    Types: Marijuana   Sexual activity: Not on file  Other Topics Concern   Not on file  Social History Narrative   Not on file   Social Drivers of Health   Financial Resource Strain: Not on file  Food Insecurity: Not on file  Transportation Needs: Not on file  Physical Activity: Not on file  Stress: Not on file  Social Connections: Not on file  Intimate Partner Violence: Not on file    Outpatient Encounter Medications as of 05/09/2023  Medication Sig   azelastine (ASTELIN) 0.1 % nasal spray Place 2 sprays into both nostrils 2 (two) times daily. Use in each nostril as directed   fexofenadine (ALLEGRA ALLERGY) 180 MG tablet Take 1 tablet (180 mg total) by mouth daily.   fluticasone (FLONASE)  50 MCG/ACT nasal spray Place into both nostrils daily.   hydrOXYzine (VISTARIL) 25 MG capsule Take 1 capsule (25 mg total) by mouth every 8 (eight) hours as needed.   baclofen (LIORESAL) 10 MG tablet Take 1 tablet (10 mg total) by mouth 3 (three) times daily. (Patient not taking: Reported on 05/09/2023)   No facility-administered encounter medications on file as of 05/09/2023.    No Known Allergies  Review of Systems As per HPI Objective:  BP 116/75   Pulse (!) 57   Temp 98.2 F (36.8 C)   Ht 6\' 3"  (1.905 m)   Wt 243 lb  (110.2 kg)   SpO2 98%   BMI 30.37 kg/m    Wt Readings from Last 3 Encounters:  05/09/23 243 lb (110.2 kg)  01/18/23 241 lb (109.3 kg)  09/11/22 238 lb 12.8 oz (108.3 kg)    Physical Exam Constitutional:      General: He is awake. He is not in acute distress.    Appearance: Normal appearance. He is well-developed and well-groomed. He is not ill-appearing, toxic-appearing or diaphoretic.  Cardiovascular:     Rate and Rhythm: Regular rhythm. Bradycardia present.     Pulses: Normal pulses.          Radial pulses are 2+ on the right side and 2+ on the left side.       Posterior tibial pulses are 2+ on the right side and 2+ on the left side.     Heart sounds: Normal heart sounds. No murmur heard.    No gallop.  Pulmonary:     Effort: Pulmonary effort is normal. No respiratory distress.     Breath sounds: Normal breath sounds. No stridor. No wheezing, rhonchi or rales.  Musculoskeletal:     Right wrist: Normal.     Left wrist: Crepitus present. No swelling, deformity, effusion, lacerations, tenderness, bony tenderness or snuff box tenderness. Decreased range of motion. Normal pulse.     Cervical back: Full passive range of motion without pain and neck supple.     Lumbar back: Spasms present. No swelling, edema, deformity, signs of trauma, lacerations, tenderness or bony tenderness. Normal range of motion. No scoliosis.     Right lower leg: No edema.     Left lower leg: No edema.  Skin:    General: Skin is warm.     Capillary Refill: Capillary refill takes less than 2 seconds.  Neurological:     General: No focal deficit present.     Mental Status: He is alert, oriented to person, place, and time and easily aroused. Mental status is at baseline.     GCS: GCS eye subscore is 4. GCS verbal subscore is 5. GCS motor subscore is 6.     Motor: No weakness.  Psychiatric:        Attention and Perception: Attention and perception normal.        Mood and Affect: Mood and affect normal.         Speech: Speech normal.        Behavior: Behavior normal. Behavior is cooperative.        Thought Content: Thought content normal. Thought content does not include homicidal or suicidal ideation. Thought content does not include homicidal or suicidal plan.        Cognition and Memory: Cognition and memory normal.        Judgment: Judgment normal.     Results for orders placed or performed in visit on 01/18/23  CBC  with Differential/Platelet   Collection Time: 01/18/23 10:52 AM  Result Value Ref Range   WBC 7.1 3.4 - 10.8 x10E3/uL   RBC 4.92 4.14 - 5.80 x10E6/uL   Hemoglobin 13.3 13.0 - 17.7 g/dL   Hematocrit 95.2 84.1 - 51.0 %   MCV 82 79 - 97 fL   MCH 27.0 26.6 - 33.0 pg   MCHC 32.8 31.5 - 35.7 g/dL   RDW 32.4 40.1 - 02.7 %   Platelets 245 150 - 450 x10E3/uL   Neutrophils 50 Not Estab. %   Lymphs 43 Not Estab. %   Monocytes 5 Not Estab. %   Eos 2 Not Estab. %   Basos 0 Not Estab. %   Neutrophils Absolute 3.4 1.4 - 7.0 x10E3/uL   Lymphocytes Absolute 3.1 0.7 - 3.1 x10E3/uL   Monocytes Absolute 0.4 0.1 - 0.9 x10E3/uL   EOS (ABSOLUTE) 0.2 0.0 - 0.4 x10E3/uL   Basophils Absolute 0.0 0.0 - 0.2 x10E3/uL   Immature Granulocytes 0 Not Estab. %   Immature Grans (Abs) 0.0 0.0 - 0.1 x10E3/uL  CMP14+EGFR   Collection Time: 01/18/23 10:52 AM  Result Value Ref Range   Glucose 95 70 - 99 mg/dL   BUN 8 6 - 20 mg/dL   Creatinine, Ser 2.53 0.76 - 1.27 mg/dL   eGFR 83 >66 YQ/IHK/7.42   BUN/Creatinine Ratio 7 (L) 9 - 20   Sodium 141 134 - 144 mmol/L   Potassium 4.1 3.5 - 5.2 mmol/L   Chloride 104 96 - 106 mmol/L   CO2 24 20 - 29 mmol/L   Calcium 8.9 8.7 - 10.2 mg/dL   Total Protein 6.6 6.0 - 8.5 g/dL   Albumin 4.1 4.1 - 5.1 g/dL   Globulin, Total 2.5 1.5 - 4.5 g/dL   Bilirubin Total <5.9 0.0 - 1.2 mg/dL   Alkaline Phosphatase 72 44 - 121 IU/L   AST 22 0 - 40 IU/L   ALT 40 0 - 44 IU/L  Lipid panel   Collection Time: 01/18/23 10:52 AM  Result Value Ref Range   Cholesterol, Total  144 100 - 199 mg/dL   Triglycerides 563 0 - 149 mg/dL   HDL 42 >87 mg/dL   VLDL Cholesterol Cal 23 5 - 40 mg/dL   LDL Chol Calc (NIH) 79 0 - 99 mg/dL   Chol/HDL Ratio 3.4 0.0 - 5.0 ratio  Vitamin B12   Collection Time: 01/18/23 10:52 AM  Result Value Ref Range   Vitamin B-12 841 232 - 1,245 pg/mL  VITAMIN D 25 Hydroxy (Vit-D Deficiency, Fractures)   Collection Time: 01/18/23 10:52 AM  Result Value Ref Range   Vit D, 25-Hydroxy 23.4 (L) 30.0 - 100.0 ng/mL  TSH   Collection Time: 01/18/23 10:52 AM  Result Value Ref Range   TSH 1.410 0.450 - 4.500 uIU/mL       05/09/2023    1:13 PM 01/18/2023   10:31 AM 09/11/2022   12:14 PM  Depression screen PHQ 2/9  Decreased Interest 1 2 2   Down, Depressed, Hopeless 1 2 2   PHQ - 2 Score 2 4 4   Altered sleeping 1 2 2   Tired, decreased energy 0 2 2  Change in appetite 1 2 2   Feeling bad or failure about yourself  0 2 2  Trouble concentrating 0 1 2  Moving slowly or fidgety/restless 0 1 1  Suicidal thoughts 0 1 0  PHQ-9 Score 4 15 15   Difficult doing work/chores Somewhat difficult Somewhat difficult Somewhat difficult  05/09/2023    1:14 PM 01/18/2023   10:32 AM 09/11/2022   12:14 PM  GAD 7 : Generalized Anxiety Score  Nervous, Anxious, on Edge 1 2 0  Control/stop worrying 0 2 1  Worry too much - different things 1 2 1   Trouble relaxing 1 2 1   Restless 0 1 2  Easily annoyed or irritable 1 3 2   Afraid - awful might happen 1 2 0  Total GAD 7 Score 5 14 7   Anxiety Difficulty  Somewhat difficult Not difficult at all    Pertinent labs & imaging results that were available during my care of the patient were reviewed by me and considered in my medical decision making.  Assessment & Plan:  Keith Lopez was seen today for congestion/rhinorrhea.  Diagnoses and all orders for this visit:  Depression, major, single episode, moderate (HCC) Referral placed as below. Patient and/or legal guardian verbally consented to [facility] services about presenting concerns and psychiatric consultation as appropriate.  The services will be billed as appropriate for the patient. Denies SI. Safety contract established.  Will start medication as below. Instructed patient to take 25 mg for one week and then start 50 mg.  -     Ambulatory referral to Integrated Behavioral Health -     sertraline (ZOLOFT) 50 MG tablet; Take 1 tablet (50 mg total) by mouth daily.  Anxiety As above.  -     Ambulatory referral to Integrated Behavioral Health -     sertraline (ZOLOFT) 50 MG tablet; Take 1 tablet (50 mg total) by mouth daily.  Chronic sinusitis, unspecified location Referral placed as below.  Refill on flonase  Encouraged patient to switch to zyrtec or xyzal if taking allegra for >3 months  Will start medication as below to trial for symptom management.  -     Ambulatory referral to ENT -     fluticasone (FLONASE) 50 MCG/ACT nasal spray; Place 1 spray into both nostrils daily. -     montelukast (SINGULAIR) 10 MG tablet; Take 1 tablet (10 mg total) by mouth at bedtime.  Chronic midline low back pain without sciatica Imaging as below. Will communicate results to patient once available. Will await results to determine next steps.  Will provide medication as below. Referral placed  -     DG Lumbar Spine 2-3 Views -     Ambulatory referral to Physical Therapy -     naproxen (NAPROSYN) 500 MG tablet; Take 1 tablet (500 mg total) by mouth 2 (two) times daily with a meal.  Chronic wrist pain, left Reviewed imaging from 2019. Will start medication as below for symptom management. Encouraged patient to follow up with PT. Referral placed.  -     Ambulatory referral to Physical Therapy -     naproxen (NAPROSYN) 500 MG tablet; Take 1 tablet (500 mg total) by mouth 2 (two) times daily with a meal.  Substance use Discussed continued use of marijuana and encouraged patient to decrease consumption.     Continue all other  maintenance medications.  Follow up plan: Return in about 2 months (around 07/07/2023) for Chronic Condition Follow up.   Continue healthy lifestyle choices, including diet (rich in fruits, vegetables, and lean proteins, and low in salt and simple carbohydrates) and exercise (at least 30 minutes of moderate physical activity daily).  Written and verbal instructions provided   The above assessment and management plan was discussed with the patient. The patient verbalized understanding of and  has agreed to the management plan. Patient is aware to call the clinic if they develop any new symptoms or if symptoms persist or worsen. Patient is aware when to return to the clinic for a follow-up visit. Patient educated on when it is appropriate to go to the emergency department.   Neale Burly, DNP-FNP [facility] [address] [place], [place] [address] [phone]

## 2023-05-21 ENCOUNTER — Ambulatory Visit: Payer: 59 | Attending: Family Medicine

## 2023-05-28 ENCOUNTER — Encounter: Payer: Self-pay | Admitting: Family Medicine

## 2023-05-28 NOTE — Progress Notes (Signed)
 Mild degenerative disc disease. Recommend patient follow up with PT.

## 2023-05-31 ENCOUNTER — Other Ambulatory Visit: Payer: Self-pay | Admitting: Family Medicine

## 2023-05-31 DIAGNOSIS — F321 Major depressive disorder, single episode, moderate: Secondary | ICD-10-CM

## 2023-05-31 DIAGNOSIS — F419 Anxiety disorder, unspecified: Secondary | ICD-10-CM

## 2023-07-04 ENCOUNTER — Institutional Professional Consult (permissible substitution) (INDEPENDENT_AMBULATORY_CARE_PROVIDER_SITE_OTHER): Payer: Self-pay | Admitting: Otolaryngology

## 2023-07-05 ENCOUNTER — Encounter: Payer: Self-pay | Admitting: Family Medicine

## 2023-07-05 ENCOUNTER — Ambulatory Visit: Payer: 59 | Admitting: Family Medicine

## 2023-07-09 ENCOUNTER — Telehealth: Payer: Self-pay | Admitting: Family Medicine

## 2023-08-04 ENCOUNTER — Other Ambulatory Visit: Payer: Self-pay | Admitting: Family Medicine

## 2023-08-04 DIAGNOSIS — J329 Chronic sinusitis, unspecified: Secondary | ICD-10-CM

## 2023-09-12 ENCOUNTER — Other Ambulatory Visit: Payer: Self-pay | Admitting: Family Medicine

## 2023-09-12 DIAGNOSIS — F419 Anxiety disorder, unspecified: Secondary | ICD-10-CM

## 2023-09-12 DIAGNOSIS — F321 Major depressive disorder, single episode, moderate: Secondary | ICD-10-CM

## 2023-09-12 NOTE — Telephone Encounter (Signed)
 Connell Degree NTBS by new provider 30-d given

## 2023-09-12 NOTE — Telephone Encounter (Signed)
 Appt made for 09/17/2023

## 2023-09-17 ENCOUNTER — Encounter: Payer: Self-pay | Admitting: Family Medicine

## 2023-09-17 ENCOUNTER — Ambulatory Visit: Admitting: Nurse Practitioner

## 2023-09-17 NOTE — Progress Notes (Deleted)
 Established Patient Office Visit  Subjective  Patient ID: Keith Lopez, male    DOB: [birth year]  Age: 38 y.o. MRN: [id number]  No chief complaint on file.   HPI Westen Duhart Depression, Follow-up  He  was last seen for this {NUMBERS 1-12:18279} {days/wks/mos/yrs:310907} ago. Changes made at last visit include ***.   He reports {excellent/good/fair/poor:19665} compliance with treatment. He {is/is not:21021397} having side effects. ***  He reports {DESC; GOOD/FAIR/POOR:18685} tolerance of treatment. Current symptoms include: {Symptoms; depression:1002} He feels he is {improved/worse/unchanged:3041574} since last visit.     05/09/2023    1:13 PM 01/18/2023   10:31 AM 09/11/2022   12:14 PM  Depression screen PHQ 2/9  Decreased Interest 1 2 2   Down, Depressed, Hopeless 1 2 2   PHQ - 2 Score 2 4 4   Altered sleeping 1 2 2   Tired, decreased energy 0 2 2  Change in appetite 1 2 2   Feeling bad or failure about yourself  0 2 2  Trouble concentrating 0 1 2  Moving slowly or fidgety/restless 0 1 1  Suicidal thoughts 0 1 0  PHQ-9 Score 4 15 15   Difficult doing work/chores Somewhat difficult Somewhat difficult Somewhat difficult    Anxiety, Follow-up  He was last seen for anxiety {NUMBERS 1-12:18279} {days/wks/mos/yrs:310907} ago. Changes made at last visit include ***.   He reports {excellent/good/fair/poor:19665} compliance with treatment. He reports {good/fair/poor:18685} tolerance of treatment. He {is/is not:21021397} having side effects. {document side effects if present:1}  He feels his anxiety is {Desc; severity:60313} and {improved/worse/unchanged:3041574} since last visit.  Symptoms: {Yes/No:20286} chest pain {Yes/No:20286} difficulty concentrating  {Yes/No:20286} dizziness {Yes/No:20286} fatigue  {Yes/No:20286} feelings of losing control {Yes/No:20286} insomnia  {Yes/No:20286} irritable {Yes/No:20286} palpitations  {Yes/No:20286} panic attacks  {Yes/No:20286} racing thoughts  {Yes/No:20286} shortness of breath {Yes/No:20286} sweating  {Yes/No:20286} tremors/shakes    GAD-7 Results    05/09/2023    1:14 PM 01/18/2023   10:32 AM 09/11/2022   12:14 PM  GAD-7 Generalized Anxiety Disorder Screening Tool  1. Feeling Nervous, Anxious, or on Edge 1 2 0  2. Not Being Able to Stop or Control Worrying 0 2 1  3. Worrying Too Much About Different Things 1 2 1   4. Trouble Relaxing 1 2 1   5. Being So Restless it's Hard To Sit Still 0 1 2  6. Becoming Easily Annoyed or Irritable 1 3 2   7. Feeling Afraid As If Something Awful Might Happen 1 2 0  Total GAD-7 Score 5 14 7   Difficulty At Work, Home, or Getting  Along With Others?  Somewhat difficult Not difficult at all    PHQ-9 Scores    05/09/2023    1:13 PM 01/18/2023   10:31 AM 09/11/2022   12:14 PM  PHQ9 SCORE ONLY  PHQ-9 Total Score 4 15 15       Patient Active Problem List   Diagnosis Date Noted   Substance use 05/09/2023   Chronic wrist pain, left 05/09/2023   Chronic midline low back pain without sciatica 05/09/2023   Chronic sinusitis 05/09/2023   Depression, major, single episode, moderate (HCC) 01/18/2023   Anxiety 01/18/2023   No past medical history on file. Past Surgical History:  Procedure Laterality Date   FEMUR SURGERY     WRIST RECONSTRUCTION     Social History   Tobacco Use   Smoking status: Never   Smokeless tobacco: Never  Vaping Use   Vaping status: Never Used  Substance Use Topics   Alcohol use: Yes    Comment:  rarely   Drug use: Yes    Types: Marijuana   Social History   Socioeconomic History   Marital status: Single    Spouse name: Not on file   Number of children: Not on file   Years of education: Not on file   Highest education level: Not on file  Occupational History   Not on file  Tobacco Use   Smoking status: Never   Smokeless tobacco: Never  Vaping Use   Vaping status: Never Used  Substance and Sexual Activity   Alcohol  use: Yes    Comment: rarely   Drug use: Yes    Types: Marijuana   Sexual activity: Not on file  Other Topics Concern   Not on file  Social History Narrative   Not on file   Social Drivers of Health   Financial Resource Strain: Not on file  Food Insecurity: Not on file  Transportation Needs: Not on file  Physical Activity: Not on file  Stress: Not on file  Social Connections: Not on file  Intimate Partner Violence: Not on file   No family status information on file.   No family history on file. No Known Allergies    ROS Negative unless indicated in HPI   Objective:     There were no vitals taken for this visit. BP Readings from Last 3 Encounters:  05/09/23 116/75  01/18/23 136/79  09/11/22 125/76   Wt Readings from Last 3 Encounters:  05/09/23 243 lb (110.2 kg)  01/18/23 241 lb (109.3 kg)  09/11/22 238 lb 12.8 oz (108.3 kg)      Physical Exam   No results found for any visits on 09/17/23.  Last CBC Lab Results  Component Value Date   WBC 7.1 01/18/2023   HGB 13.3 01/18/2023   HCT 40.5 01/18/2023   MCV 82 01/18/2023   MCH 27.0 01/18/2023   RDW 13.1 01/18/2023   PLT 245 01/18/2023   Last metabolic panel Lab Results  Component Value Date   GLUCOSE 95 01/18/2023   NA 141 01/18/2023   K 4.1 01/18/2023   CL 104 01/18/2023   CO2 24 01/18/2023   BUN 8 01/18/2023   CREATININE 1.17 01/18/2023   EGFR 83 01/18/2023   CALCIUM 8.9 01/18/2023   PROT 6.6 01/18/2023   ALBUMIN 4.1 01/18/2023   LABGLOB 2.5 01/18/2023   BILITOT <0.2 01/18/2023   ALKPHOS 72 01/18/2023   AST 22 01/18/2023   ALT 40 01/18/2023   ANIONGAP 9 07/31/2016   Last lipids Lab Results  Component Value Date   CHOL 144 01/18/2023   HDL 42 01/18/2023   LDLCALC 79 01/18/2023   TRIG 126 01/18/2023   CHOLHDL 3.4 01/18/2023    Last thyroid functions Lab Results  Component Value Date   TSH 1.410 01/18/2023        Assessment & Plan:  There are no diagnoses linked to this  encounter. Continue healthy lifestyle choices, including diet (rich in fruits, vegetables, and lean proteins, and low in salt and simple carbohydrates) and exercise (at least 30 minutes of moderate physical activity daily).     The above assessment and management plan was discussed with the patient. The patient verbalized understanding of and has agreed to the management plan. Patient is aware to call the clinic if they develop any new symptoms or if symptoms persist or worsen. Patient is aware when to return to the clinic for a follow-up visit. Patient educated on when it is appropriate to go  to the emergency department.  No follow-ups on file.    Leveta Wahab [place] Thompson, DNP [facility] [address] [place], [place] [address] [phone]    Note: This document was prepared by [employer] voice dictation technology and any errors that results from this process are unintentional.
